# Patient Record
Sex: Female | Born: 1955 | Race: White | Hispanic: No | Marital: Married
Health system: Southern US, Community
[De-identification: ages and names within clinical notes are randomized; demographics above are authoritative.]

## PROBLEM LIST (undated history)

## (undated) DIAGNOSIS — I10 Essential (primary) hypertension: Secondary | ICD-10-CM

## (undated) DIAGNOSIS — E079 Disorder of thyroid, unspecified: Secondary | ICD-10-CM

## (undated) HISTORY — DX: Essential (primary) hypertension: I10

## (undated) HISTORY — DX: Disorder of thyroid, unspecified: E07.9

## (undated) HISTORY — PX: TUBAL LIGATION: SHX77

## (undated) HISTORY — PX: HERNIA REPAIR: SHX51

---

## 2009-05-16 ENCOUNTER — Encounter: Admission: RE | Admit: 2009-05-16 | Discharge: 2009-05-16 | Payer: Self-pay | Admitting: Unknown Physician Specialty

## 2010-04-27 IMAGING — CR DG ABDOMEN 2V
4 series · 4 of 4 positions shown · non-contrast
Comparison: None

CLINICAL DATA: Abdominal cramping, nausea and vomiting

ABDOMEN - 2 VIEW

[view not recorded (1 of 4)]
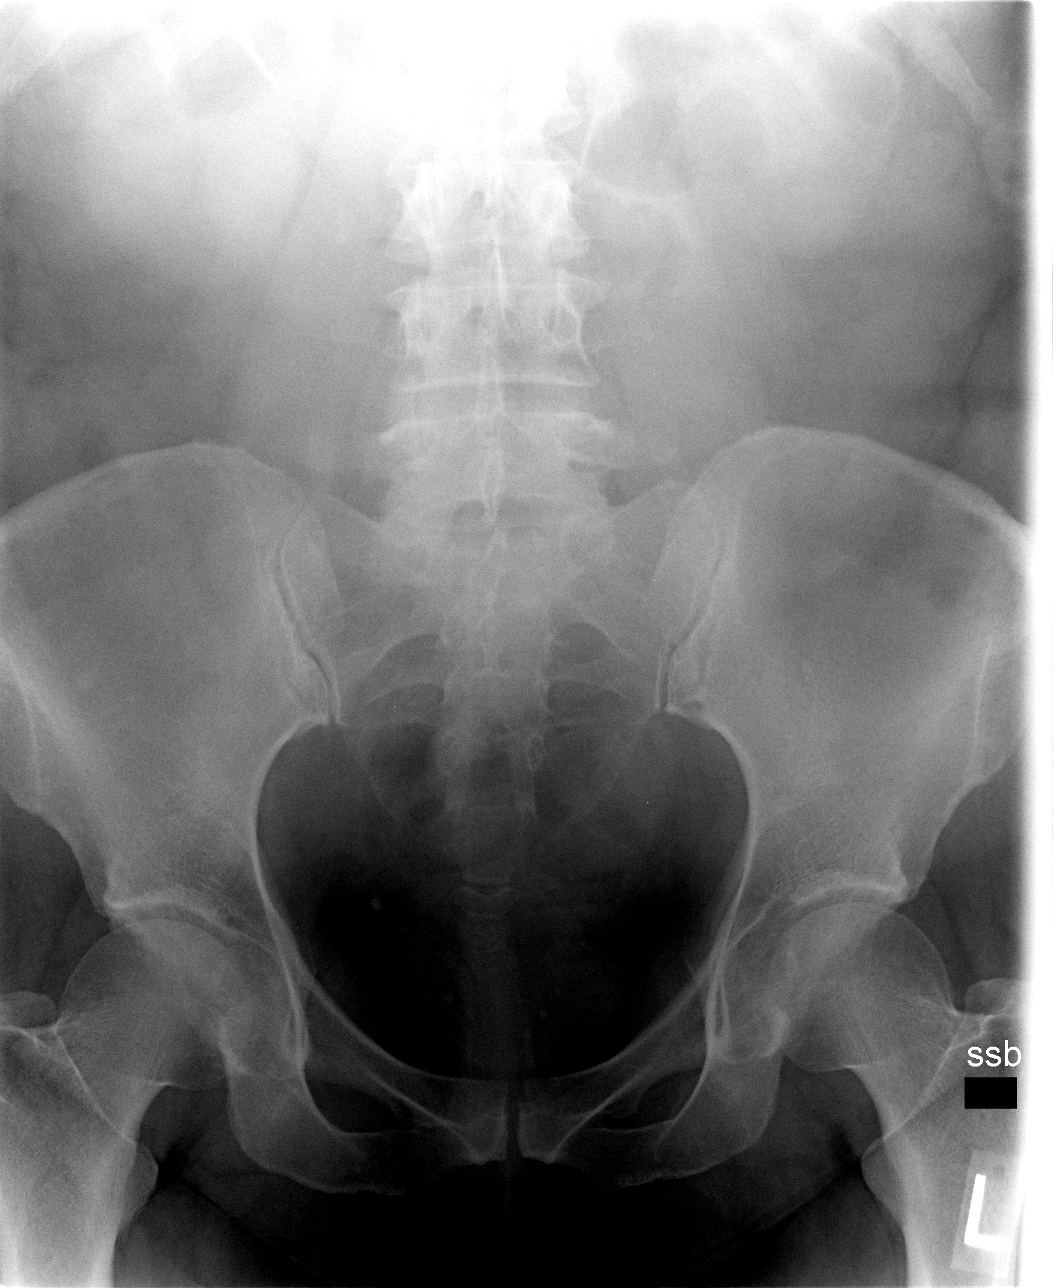

[view not recorded (2 of 4)]
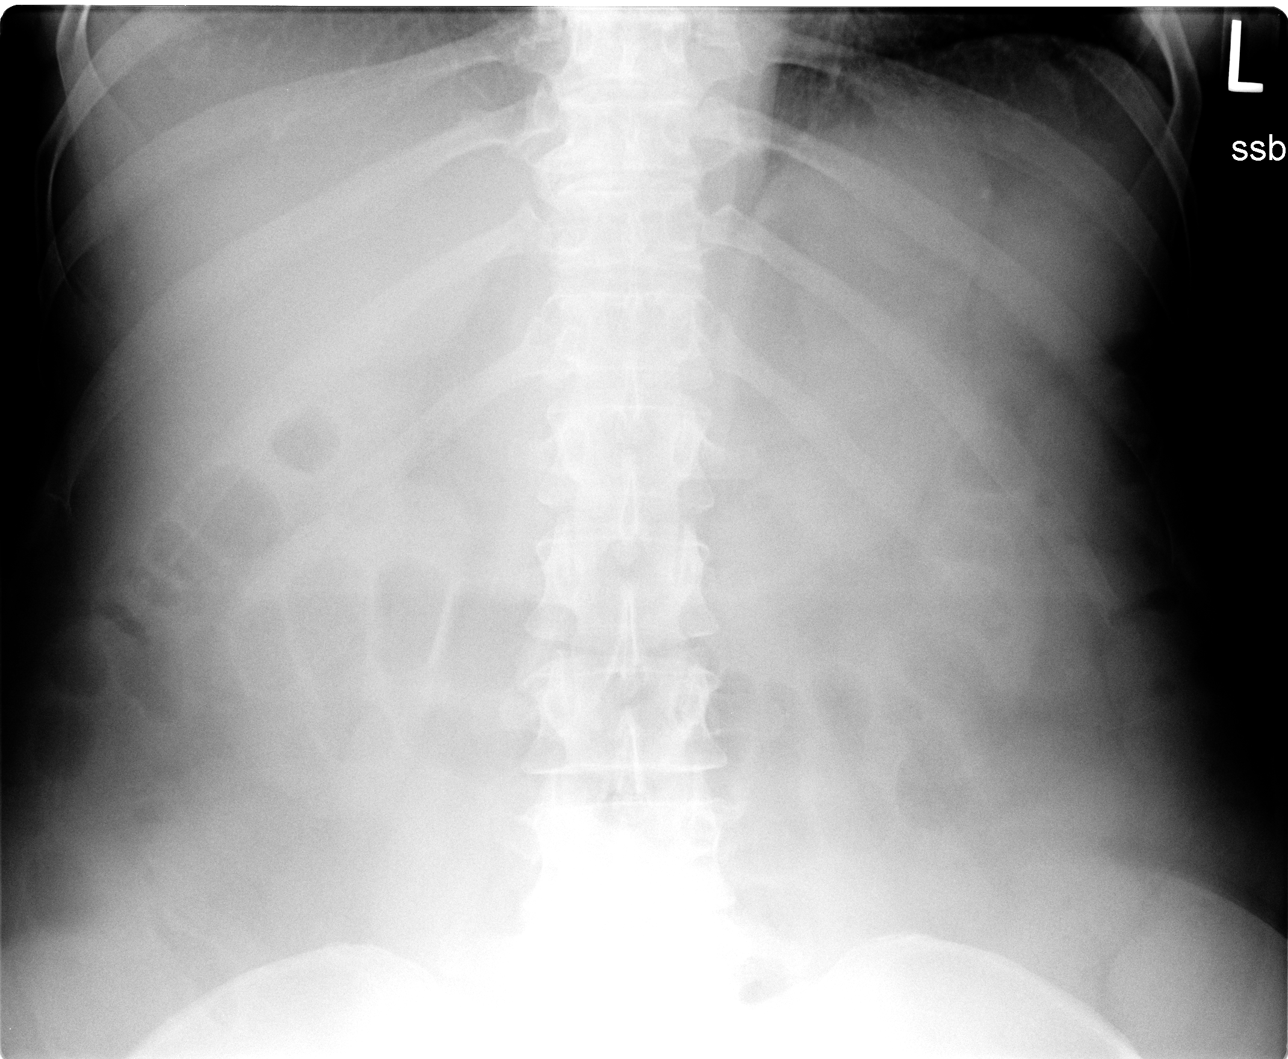

[view not recorded (3 of 4)]
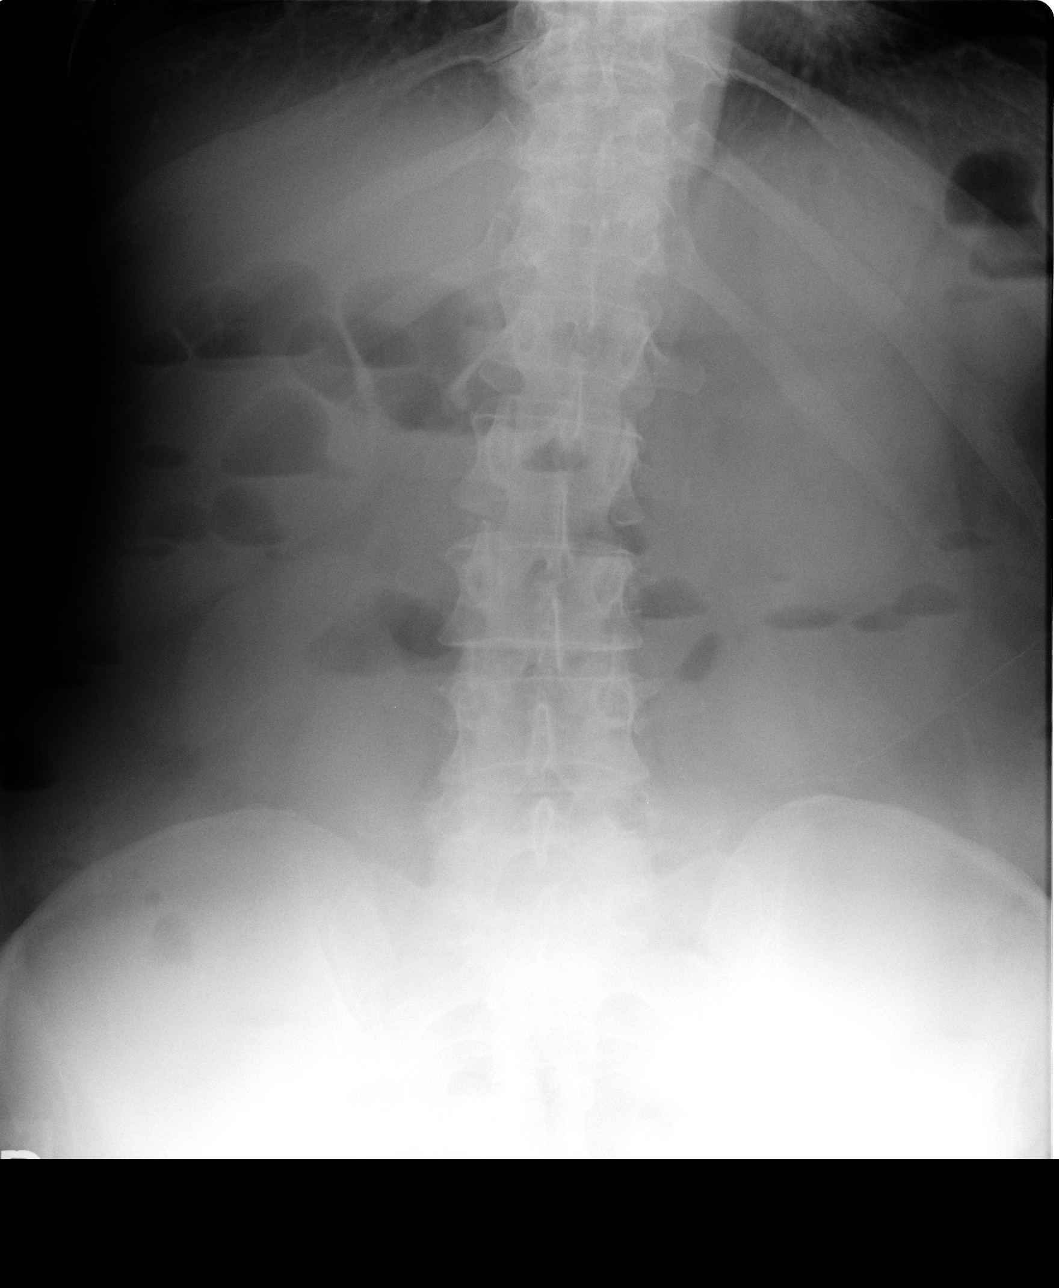

[view not recorded (4 of 4)]
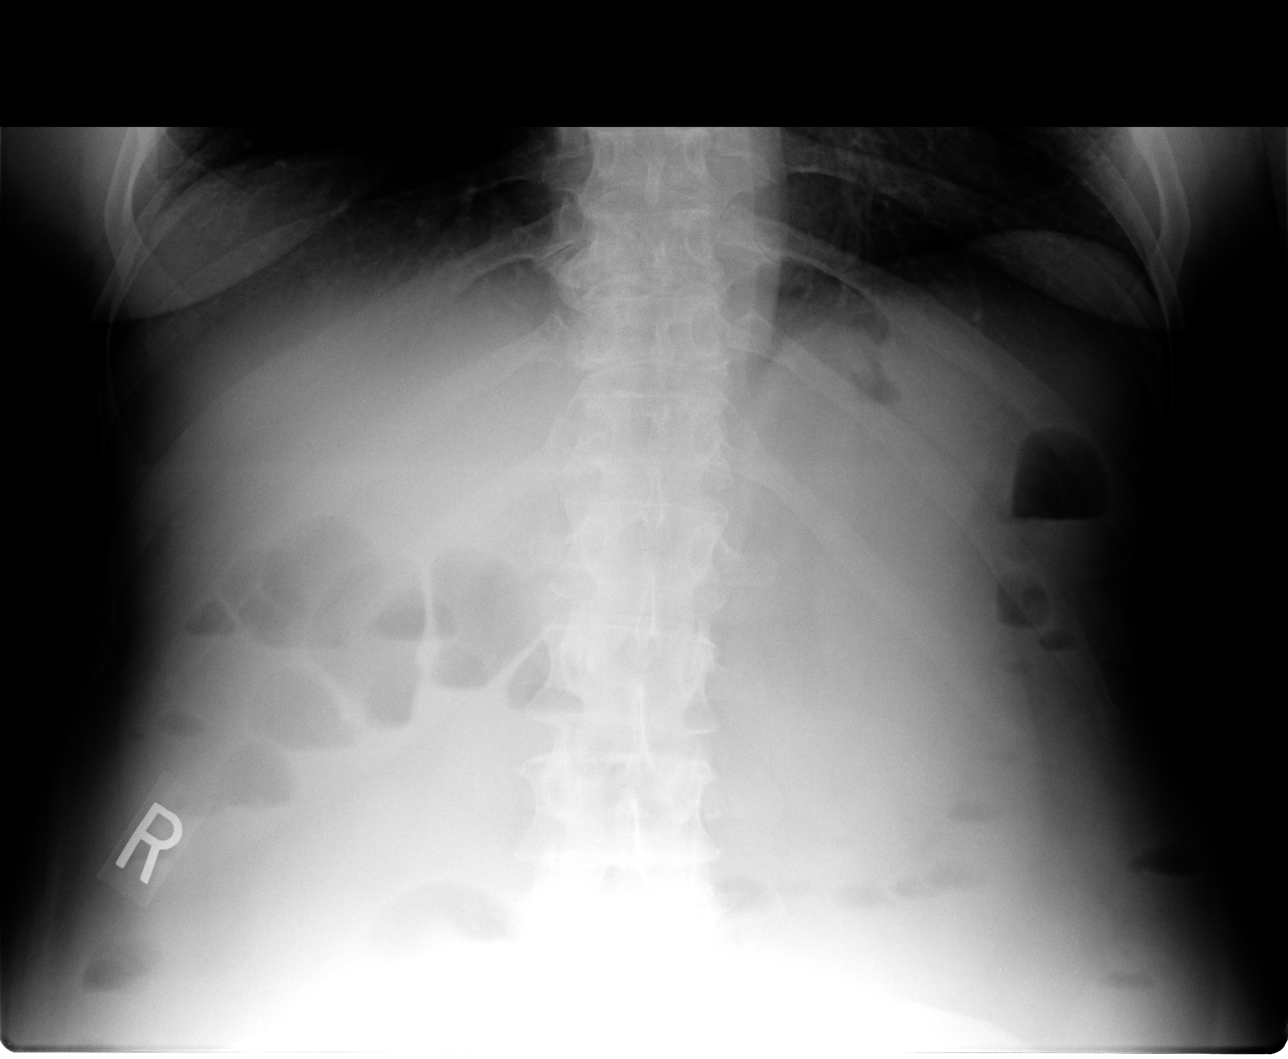

[4 of 4 positions shown; findings below may reference images not displayed]

FINDINGS: Supine and erect views of the abdomen show no bowel
obstruction.  There are a few scattered air-fluid levels primarily
throughout the colon most likely representing ileus or due to
diarrhea or recent enemas.  No opaque calculi are seen.
Calcifications in the bony pelvis are most typical of phleboliths.
IMPRESSION: No bowel obstruction.  Possible mild ileus versus diarrhea or
recent enemas with air fluid levels scattered throughout the colon.

## 2011-12-25 ENCOUNTER — Encounter: Payer: Self-pay | Admitting: Family Medicine

## 2011-12-25 ENCOUNTER — Ambulatory Visit (INDEPENDENT_AMBULATORY_CARE_PROVIDER_SITE_OTHER): Payer: Self-pay | Admitting: Family Medicine

## 2011-12-25 VITALS — BP 156/89 | HR 61 | Temp 98.2°F | Ht 67.0 in | Wt 237.0 lb

## 2011-12-25 DIAGNOSIS — E119 Type 2 diabetes mellitus without complications: Secondary | ICD-10-CM

## 2011-12-25 DIAGNOSIS — F411 Generalized anxiety disorder: Secondary | ICD-10-CM

## 2011-12-25 DIAGNOSIS — E039 Hypothyroidism, unspecified: Secondary | ICD-10-CM

## 2011-12-25 DIAGNOSIS — K7689 Other specified diseases of liver: Secondary | ICD-10-CM

## 2011-12-25 DIAGNOSIS — K7581 Nonalcoholic steatohepatitis (NASH): Secondary | ICD-10-CM

## 2011-12-25 DIAGNOSIS — Z780 Asymptomatic menopausal state: Secondary | ICD-10-CM | POA: Insufficient documentation

## 2011-12-25 DIAGNOSIS — G4700 Insomnia, unspecified: Secondary | ICD-10-CM | POA: Insufficient documentation

## 2011-12-25 DIAGNOSIS — F419 Anxiety disorder, unspecified: Secondary | ICD-10-CM

## 2011-12-25 DIAGNOSIS — I10 Essential (primary) hypertension: Secondary | ICD-10-CM | POA: Insufficient documentation

## 2011-12-25 MED ORDER — ESTRADIOL 0.5 MG PO TABS: 0.5000 mg | ORAL_TABLET | Freq: Every day | ORAL | Status: DC

## 2011-12-25 MED ORDER — AMLODIPINE BESYLATE 10 MG PO TABS: 10.0000 mg | ORAL_TABLET | Freq: Every day | ORAL | Status: DC

## 2011-12-25 MED ORDER — METFORMIN HCL 500 MG PO TABS: 500.0000 mg | ORAL_TABLET | Freq: Every day | ORAL | Status: DC

## 2011-12-25 MED ORDER — AMITRIPTYLINE HCL 25 MG PO TABS: 25.0000 mg | ORAL_TABLET | Freq: Every day | ORAL | Status: DC

## 2011-12-25 MED ORDER — LEVOTHYROXINE SODIUM 50 MCG PO TABS: 50.0000 ug | ORAL_TABLET | Freq: Every day | ORAL | Status: DC

## 2011-12-25 NOTE — Assessment & Plan Note (Signed)
Last labwork shows nromal TSh and free t4 on  50 mcg.  Will recheck .

## 2011-12-25 NOTE — Assessment & Plan Note (Signed)
Discussed avoidance of liver toxins.  She declines desire to cut back on drinking at this time. Will check lipids and LFTs

## 2011-12-25 NOTE — Assessment & Plan Note (Signed)
Chronic, has been treated with amitryptiline 25 mg by previous provider, i have refilled today

## 2011-12-25 NOTE — Assessment & Plan Note (Signed)
Has been offered celexa by previous provider.  Did not want to take daily medications.  Has been using some clonazepam prn. Due to office policy, will not refill until seen in follow-up- will discuss in more detail and need for more comprehensive treatment.

## 2011-12-25 NOTE — Assessment & Plan Note (Signed)
Has been on hormone replacement for approx for 5 years.  No vasomotor symptoms and states was not ever severe.  Will titrate off.  Change estradiol from 1.0 mg to 0.5 mg for the next month, continue provera.  Then can likely titrate off.

## 2011-12-25 NOTE — Progress Notes (Addendum)
  Subjective:    Patient ID: Brooke Morris, female    DOB: [birth year], 56 y.o.   MRN: [id number]  HPI here to establish primary care  HYPERTENSION- needs refills on meds- has run out  BP Readings from Last 3 Encounters:  12/25/11 156/89    Hypertension ROS: taking medications as instructed, not taking medications regularly as instructed, no chest pain on exertion, no dyspnea on exertion and no swelling of ankles.   DIABETES  Taking and tolerating: metformin 500 qod Fasting blood sugars:none Hypoglycemic symptoms: no Visual problems: no Monitoring feet: yes Numbness/Tingling: no a1c on old records reports 6.2% Diabetic Labs:  No results found for this basename: HGBA1C   No results found for this basename: GLUF, MICROALBUR, LDLCALC, CREATININE   Last microalbumin: No results found for this basename: MICROALBUR, [id number]   Requests refill of clonazepam.  Discussed office policiy of no controlled substances on first visit.  Will follow-up to discuss her history of anxiety in more detail.       Review of SystemsPatient Information Form: Screening and ROS  AUDIT-C Score: 4 Do you feel safe in relationships? yes PHQ-2:positive  Review of Symptoms  General:  Negative for nexplained weight loss, fever Skin: Negative for new or changing mole, sore that won't heal HEENT: Negative for trouble hearing, trouble seeing, ringing in ears, mouth sores, hoarseness, change in voice, dysphagia. CV:  Negative for chest pain, dyspnea, edema, palpitations Resp: Negative for cough, dyspnea, hemoptysis GI: Negative for nausea, vomiting, diarrhea, constipation, abdominal pain, melena, hematochezia. GU: Negative for dysuria, incontinence, urinary hesitance, hematuria, vaginal or penile discharge, polyuria, sexual difficulty, lumps in testicle or breasts MSK: Negative for muscle cramps or aches, joint pain or swelling Neuro: Negative for headaches, weakness, numbness, dizziness, passing  out/fainting Psych: POSITIVE for depression, anxiety, memory problems  I have reviewed patient's  PMH, FH, and Social history and Medications as related to this visit. See EMR     Objective:   Physical Exam  GEN: Alert & Oriented, No acute distress HEENT: [place]/AT. EOMI, PERRLA, no conjunctival injection or scleral icterus.  Bilateral tympanic membranes intact without erythema or effusion.  .  Nares without edema or rhinorrhea.  Oropharynx is without erythema or exudates.  No anterior or posterior cervical lymphadenopathy. CV:  Regular Rate & Rhythm, no murmur Respiratory:  Normal work of breathing, CTAB Abd:  + BS, soft, no tenderness to palpation Ext: no pre-tibial edema       Assessment & Plan:  DEX 2010 normal Pap 2012: normal Mammogram 2012: normal Colonosocpy 20122: normal

## 2011-12-25 NOTE — Assessment & Plan Note (Signed)
Above goal.  Will refill medications, patient to follow-up for fasting bloodwork as soon as financial assistance approved.

## 2011-12-25 NOTE — Patient Instructions (Addendum)
Will decrease estradiol for 0.5 mg per day for next month, then can discontinue Keep taking progesterone at same dose  Make appointment for fasting bloodwork and follow-up appt  Nice to meet you!

## 2012-02-11 ENCOUNTER — Other Ambulatory Visit: Payer: Self-pay | Admitting: *Deleted

## 2012-02-11 MED ORDER — LISINOPRIL-HYDROCHLOROTHIAZIDE 20-12.5 MG PO TABS: 2.0000 | ORAL_TABLET | Freq: Every day | ORAL | Status: DC

## 2012-03-22 ENCOUNTER — Other Ambulatory Visit: Payer: Self-pay | Admitting: Family Medicine

## 2012-03-25 ENCOUNTER — Telehealth: Payer: Self-pay | Admitting: *Deleted

## 2012-03-26 ENCOUNTER — Other Ambulatory Visit (INDEPENDENT_AMBULATORY_CARE_PROVIDER_SITE_OTHER): Payer: Self-pay

## 2012-03-26 DIAGNOSIS — E119 Type 2 diabetes mellitus without complications: Secondary | ICD-10-CM

## 2012-03-26 DIAGNOSIS — K7581 Nonalcoholic steatohepatitis (NASH): Secondary | ICD-10-CM

## 2012-03-26 DIAGNOSIS — I10 Essential (primary) hypertension: Secondary | ICD-10-CM

## 2012-03-26 DIAGNOSIS — E039 Hypothyroidism, unspecified: Secondary | ICD-10-CM

## 2012-03-26 LAB — LIPID PANEL: Cholesterol: 214 mg/dL — ABNORMAL HIGH (ref 0–200)

## 2012-03-26 LAB — CBC WITH DIFFERENTIAL/PLATELET
Basophils Absolute: 0.1 10*3/uL (ref 0.0–0.1)
Lymphocytes Relative: 27 % (ref 12–46)
Lymphs Abs: 1.6 10*3/uL (ref 0.7–4.0)
MCV: 91.9 fL (ref 78.0–100.0)
Neutro Abs: 3.6 10*3/uL (ref 1.7–7.7)
Neutrophils Relative %: 60 % (ref 43–77)
Platelets: 227 10*3/uL (ref 150–400)
RBC: 4.7 MIL/uL (ref 3.87–5.11)
WBC: 5.9 10*3/uL (ref 4.0–10.5)

## 2012-03-26 LAB — COMPREHENSIVE METABOLIC PANEL
ALT: 121 U/L — ABNORMAL HIGH (ref 0–35)
AST: 78 U/L — ABNORMAL HIGH (ref 0–37)
CO2: 27 mEq/L (ref 19–32)
Calcium: 9.6 mg/dL (ref 8.4–10.5)
Chloride: 102 mEq/L (ref 96–112)
Potassium: 4.3 mEq/L (ref 3.5–5.3)
Sodium: 138 mEq/L (ref 135–145)
Total Protein: 7.2 g/dL (ref 6.0–8.3)

## 2012-03-26 LAB — POCT GLYCOSYLATED HEMOGLOBIN (HGB A1C): Hemoglobin A1C: 6

## 2012-03-26 LAB — TSH: TSH: 1.767 u[IU]/mL (ref 0.350–4.500)

## 2012-03-26 NOTE — Progress Notes (Signed)
CBC WITH DIFF,CMP,FLP,TSH AND HGBA1C DONE TODAY Brooke Morris

## 2012-03-28 ENCOUNTER — Encounter: Payer: Self-pay | Admitting: Family Medicine

## 2012-03-29 ENCOUNTER — Other Ambulatory Visit: Payer: Self-pay | Admitting: Family Medicine

## 2012-04-01 ENCOUNTER — Ambulatory Visit: Payer: Self-pay | Admitting: Family Medicine

## 2012-04-02 ENCOUNTER — Encounter: Payer: Self-pay | Admitting: Family Medicine

## 2012-04-02 ENCOUNTER — Ambulatory Visit (INDEPENDENT_AMBULATORY_CARE_PROVIDER_SITE_OTHER): Payer: Self-pay | Admitting: Family Medicine

## 2012-04-02 VITALS — BP 132/76 | HR 84 | Temp 98.1°F | Ht 67.0 in | Wt 235.8 lb

## 2012-04-02 DIAGNOSIS — F411 Generalized anxiety disorder: Secondary | ICD-10-CM

## 2012-04-02 DIAGNOSIS — K7689 Other specified diseases of liver: Secondary | ICD-10-CM

## 2012-04-02 DIAGNOSIS — F419 Anxiety disorder, unspecified: Secondary | ICD-10-CM

## 2012-04-02 DIAGNOSIS — K029 Dental caries, unspecified: Secondary | ICD-10-CM

## 2012-04-02 DIAGNOSIS — K7581 Nonalcoholic steatohepatitis (NASH): Secondary | ICD-10-CM

## 2012-04-02 MED ORDER — CYCLOBENZAPRINE HCL 5 MG PO TABS: 5.0000 mg | ORAL_TABLET | Freq: Two times a day (BID) | ORAL | Status: DC | PRN

## 2012-04-02 MED ORDER — CLONAZEPAM 0.5 MG PO TABS: 0.5000 mg | ORAL_TABLET | Freq: Every day | ORAL | Status: DC | PRN

## 2012-04-02 NOTE — Assessment & Plan Note (Signed)
Referral for dentist in process.  Advised patient to call doctor if she develops fever, chills, nausea/vomiting, or worsening mouth pain.

## 2012-04-02 NOTE — Assessment & Plan Note (Signed)
Discussed elevated enzymes with patient.  She admits to heavy alcohol use. - Strongly advised patient to cut back on alcohol use - Patient to discuss chronic issues with PCP

## 2012-04-02 NOTE — Assessment & Plan Note (Signed)
Patient requesting Clonazepam for anxiety.  She says she does not take it daily.  She is not interested in taking an SSRI at this time, instead she uses SAM-e. - Discussed with patient that I can fill her Clonazepam x 1 month only until she can follow up with her PCP

## 2012-04-02 NOTE — Progress Notes (Signed)
  Subjective:    Patient ID: Brooke Morris, female    DOB: 01-28-1956, 56 y.o.   MRN: [id number]  HPI  Dental cavity: Patient needs a referral to a dentist.  She has the orange card and understands that she will be put on a waiting list.  She denies any fever, chills, N/V, or SOB at this time.  She only complains of pain with chewing solid food.  Denies any tooth abscess, swelling, or drooling.   Hx anxiety: Patient asking for refills of Clonazepam.  She is about to run out in a few days.  Patient says she only takes it as needed, which is about once per week.  Patient understands that I am not her PCP and refills will need to be through her PCP from now on.  She currently takes SAM-E for depression/anxiety, but says her "nerves" have been getting worse.  She is not interested in an SSRI at this time.  Muscle spasms: Patient complains of acute thoracic back pain.  She thinks she may have pulled a muscle last week.  Whenever she tries to stand from sitting too long, she says her "muscle catches" and thinks it could be a spasm.  Patient asking for a muscle relaxer.  Denies any numbness/tingling of extremities.  Denies any loss of bowel or urinary incontinence.  Able to ambulate without difficulty.   Review of Systems  Per HPI    Objective:   Physical Exam  Constitutional: No distress.  HENT:  Head: Normocephalic and atraumatic. No trismus in the jaw.  Mouth/Throat: Oropharynx is clear and moist and mucous membranes are normal. No oral lesions. Abnormal dentition. Dental caries present. No dental abscesses.  No palpable facial abscess.  No cervical LAD.        Assessment & Plan:

## 2012-04-02 NOTE — Telephone Encounter (Signed)
Left mess for A1C check 

## 2012-04-02 NOTE — Patient Instructions (Addendum)
It was nice to meet you today Brooke Morris. Please take Flexeril as needed for muscle spasms. For anxiety, I will give you a 1 month supply of Clonazepam until you can follow up with your PCP. A dental referral is in process.  Please call your doctor if you develop worsening pain, fever, chills, nausea or vomiting.

## 2012-04-28 ENCOUNTER — Other Ambulatory Visit: Payer: Self-pay | Admitting: Family Medicine

## 2012-05-29 ENCOUNTER — Other Ambulatory Visit: Payer: Self-pay | Admitting: Family Medicine

## 2012-05-31 ENCOUNTER — Other Ambulatory Visit: Payer: Self-pay | Admitting: Family Medicine

## 2012-06-03 ENCOUNTER — Ambulatory Visit: Payer: PRIVATE HEALTH INSURANCE | Admitting: *Deleted

## 2012-06-17 ENCOUNTER — Encounter: Payer: Self-pay | Admitting: Family Medicine

## 2012-06-17 ENCOUNTER — Ambulatory Visit (INDEPENDENT_AMBULATORY_CARE_PROVIDER_SITE_OTHER): Payer: PRIVATE HEALTH INSURANCE | Admitting: Family Medicine

## 2012-06-17 VITALS — BP 136/88 | HR 80 | Temp 99.3°F | Ht 67.0 in | Wt 237.0 lb

## 2012-06-17 DIAGNOSIS — K7581 Nonalcoholic steatohepatitis (NASH): Secondary | ICD-10-CM

## 2012-06-17 DIAGNOSIS — R0609 Other forms of dyspnea: Secondary | ICD-10-CM

## 2012-06-17 DIAGNOSIS — K7689 Other specified diseases of liver: Secondary | ICD-10-CM

## 2012-06-17 DIAGNOSIS — F419 Anxiety disorder, unspecified: Secondary | ICD-10-CM

## 2012-06-17 DIAGNOSIS — R0683 Snoring: Secondary | ICD-10-CM

## 2012-06-17 DIAGNOSIS — Z780 Asymptomatic menopausal state: Secondary | ICD-10-CM

## 2012-06-17 DIAGNOSIS — I10 Essential (primary) hypertension: Secondary | ICD-10-CM

## 2012-06-17 DIAGNOSIS — F411 Generalized anxiety disorder: Secondary | ICD-10-CM

## 2012-06-17 LAB — CBC WITH DIFFERENTIAL/PLATELET
Hemoglobin: 14.9 g/dL (ref 12.0–15.0)
Lymphs Abs: 1.9 10*3/uL (ref 0.7–4.0)
MCH: 32.3 pg (ref 26.0–34.0)
Monocytes Relative: 8 % (ref 3–12)
Neutro Abs: 4.7 10*3/uL (ref 1.7–7.7)
Neutrophils Relative %: 63 % (ref 43–77)
RBC: 4.61 MIL/uL (ref 3.87–5.11)

## 2012-06-17 LAB — COMPREHENSIVE METABOLIC PANEL
ALT: 147 U/L — ABNORMAL HIGH (ref 0–35)
AST: 69 U/L — ABNORMAL HIGH (ref 0–37)
Albumin: 4.2 g/dL (ref 3.5–5.2)
CO2: 28 mEq/L (ref 19–32)
Calcium: 10.1 mg/dL (ref 8.4–10.5)
Chloride: 101 mEq/L (ref 96–112)
Creat: 0.66 mg/dL (ref 0.50–1.10)
Sodium: 137 mEq/L (ref 135–145)
Total Bilirubin: 0.5 mg/dL (ref 0.3–1.2)
Total Protein: 7.2 g/dL (ref 6.0–8.3)

## 2012-06-17 LAB — TSH: TSH: 0.817 u[IU]/mL (ref 0.350–4.500)

## 2012-06-17 NOTE — Patient Instructions (Addendum)
Stop taking hormones- estrogen and provera  Stop taking clonazepam  Don't hesitate to get support with quitting drinking.  It's the best thing you can do for your health.  I recommend contacting AA.  Schedule renewal of orange card with mammie  Will obtain records from your GI office  Follow-up in 3-4 weeks

## 2012-06-17 NOTE — Assessment & Plan Note (Signed)
Encouraged her to discontinue use of clonazepam (has been using it rarely) as I feel risks outweight benefits.  We will discuss intermittent anxiety in more detail at follow-up visit.

## 2012-06-17 NOTE — Assessment & Plan Note (Signed)
Discussed what ascites mean with patient and reassured did not see any fluid collection that was concerning for liver failure.  Advised that most important step now is to protect liver, which involves lifestyle modification, avoidance of alcohol and tylenol.  Will check LFT's today.

## 2012-06-17 NOTE — Assessment & Plan Note (Signed)
Discussed with patient snoring may be a sign of sleep apnea.  We discussed diagnosis and treatment if sleep apnea were discovered.  We decided to defer this for now as she would not be able to afford CPAP if diagnosed as she is uninsured.  Encouraged exercise and weight loss.

## 2012-06-17 NOTE — Assessment & Plan Note (Signed)
Has titrated off hormone replacement. Discussed with patient would not refill any longer.   Will remove from medication list

## 2012-06-17 NOTE — Assessment & Plan Note (Signed)
Elevated.  Encouraged patient to perform home BP monitoring.  Will follow-up in 3-4 weeks

## 2012-06-17 NOTE — Progress Notes (Signed)
  Subjective:    Patient ID: Brooke Morris, female    DOB: December 31, 1955, 57 y.o.   MRN: [id number]  HPIhere for concerns about abdomen  Was with sister who felt she had sleep apnea and ascites due to full abdomen (not new) and hearing her snore with apneic episodes at night..  No abdominal pain,, fever, no diarrhea or emesis  Diagnosed with fatty.liver in the past.  No weight gain. Reports drinking 5-6 glasses of win per day, especially over the holidays.   Feels at baseline state of health.   Records patient brought from previous PCP were reviewed with patient- did not include any biopsy or imagine results related to liver.    I obtained outside records from [facility] from [place] and reviewed today. Liver biopsy 06-2011 shows steatohepatitis.  Hepatitis Panel, Actin antibody, mitochondiral antbidoysed rate, alpha 1 antitrypsin, and ANA normal.  .Review of Systems See HPI    Objective:   Physical Exam  GEN: Alert & Oriented, No acute distress CV:  Regular Rate & Rhythm, no murmur Respiratory:  Normal work of breathing, CTAB Abd:  + BS, soft, no tenderness to palpation, no obvious ascited, obese Ext: 1+ bialteral pre-tibial edema      Assessment & Plan:

## 2012-06-18 ENCOUNTER — Telehealth: Payer: Self-pay | Admitting: Family Medicine

## 2012-06-18 NOTE — Telephone Encounter (Signed)
Called to discuss lab results.  Left message that labs nromal except for elevated LFT's which are at the same level as previous.  Again encouraged healthy lifestyle, avoiding liver toxins.  Invited her to call back qith questions or concerns.

## 2012-06-27 ENCOUNTER — Other Ambulatory Visit: Payer: Self-pay | Admitting: Family Medicine

## 2012-07-07 ENCOUNTER — Ambulatory Visit: Payer: PRIVATE HEALTH INSURANCE | Admitting: Family Medicine

## 2012-07-28 ENCOUNTER — Other Ambulatory Visit: Payer: Self-pay | Admitting: Family Medicine

## 2012-10-08 ENCOUNTER — Telehealth: Payer: Self-pay | Admitting: Family Medicine

## 2012-10-08 MED ORDER — AMLODIPINE BESYLATE 10 MG PO TABS: ORAL_TABLET | ORAL | Status: AC

## 2012-10-08 MED ORDER — AMITRIPTYLINE HCL 25 MG PO TABS: ORAL_TABLET | ORAL | Status: AC

## 2012-10-08 NOTE — Telephone Encounter (Signed)
Pt used to use [facility] and now they have closed and needs 2 of her meds transferred to [facility]- [place]  Needs Amlodipine & Elavil called in.

## 2012-12-04 ENCOUNTER — Other Ambulatory Visit: Payer: Self-pay

## 2013-09-30 ENCOUNTER — Telehealth: Payer: Self-pay | Admitting: *Deleted

## 2013-09-30 NOTE — Telephone Encounter (Signed)
Called to set up a follow up appointment for diabetes/cholesterol, patient informed me that she is no longer a patient here.
